# Patient Record
Sex: Male | Born: 1960 | Race: Black or African American | Hispanic: No | Marital: Single
Health system: Southern US, Community
[De-identification: ages and names within clinical notes are randomized; demographics above are authoritative.]

## PROBLEM LIST (undated history)

## (undated) DIAGNOSIS — S0232XA Fracture of orbital floor, left side, initial encounter for closed fracture: Secondary | ICD-10-CM

## (undated) DIAGNOSIS — F32A Depression, unspecified: Secondary | ICD-10-CM

## (undated) DIAGNOSIS — F329 Major depressive disorder, single episode, unspecified: Secondary | ICD-10-CM

## (undated) HISTORY — DX: Fracture of orbital floor, left side, initial encounter for closed fracture: S02.32XA

---

## 2015-04-11 ENCOUNTER — Observation Stay (HOSPITAL_COMMUNITY): Payer: No Typology Code available for payment source

## 2015-04-11 ENCOUNTER — Inpatient Hospital Stay (HOSPITAL_COMMUNITY)
Admission: EM | Admit: 2015-04-11 | Discharge: 2015-04-16 | DRG: 206 | Disposition: A | Discharge status: Home Health | Payer: No Typology Code available for payment source | Attending: General Surgery | Admitting: General Surgery

## 2015-04-11 ENCOUNTER — Emergency Department (HOSPITAL_COMMUNITY): Payer: No Typology Code available for payment source

## 2015-04-11 ENCOUNTER — Encounter (HOSPITAL_COMMUNITY): Payer: Self-pay | Admitting: Emergency Medicine

## 2015-04-11 DIAGNOSIS — S43102A Unspecified dislocation of left acromioclavicular joint, initial encounter: Secondary | ICD-10-CM | POA: Diagnosis present

## 2015-04-11 DIAGNOSIS — S82832A Other fracture of upper and lower end of left fibula, initial encounter for closed fracture: Secondary | ICD-10-CM

## 2015-04-11 DIAGNOSIS — S0230XA Fracture of orbital floor, unspecified side, initial encounter for closed fracture: Secondary | ICD-10-CM

## 2015-04-11 DIAGNOSIS — R52 Pain, unspecified: Secondary | ICD-10-CM

## 2015-04-11 DIAGNOSIS — F4323 Adjustment disorder with mixed anxiety and depressed mood: Secondary | ICD-10-CM | POA: Diagnosis not present

## 2015-04-11 DIAGNOSIS — S2232XA Fracture of one rib, left side, initial encounter for closed fracture: Principal | ICD-10-CM | POA: Diagnosis present

## 2015-04-11 DIAGNOSIS — S8252XA Displaced fracture of medial malleolus of left tibia, initial encounter for closed fracture: Secondary | ICD-10-CM | POA: Diagnosis present

## 2015-04-11 DIAGNOSIS — F1490 Cocaine use, unspecified, uncomplicated: Secondary | ICD-10-CM | POA: Diagnosis present

## 2015-04-11 DIAGNOSIS — Y92410 Unspecified street and highway as the place of occurrence of the external cause: Secondary | ICD-10-CM

## 2015-04-11 DIAGNOSIS — S82831A Other fracture of upper and lower end of right fibula, initial encounter for closed fracture: Secondary | ICD-10-CM | POA: Diagnosis present

## 2015-04-11 DIAGNOSIS — F10129 Alcohol abuse with intoxication, unspecified: Secondary | ICD-10-CM | POA: Diagnosis present

## 2015-04-11 DIAGNOSIS — H5712 Ocular pain, left eye: Secondary | ICD-10-CM | POA: Diagnosis not present

## 2015-04-11 DIAGNOSIS — S0232XA Fracture of orbital floor, left side, initial encounter for closed fracture: Secondary | ICD-10-CM | POA: Diagnosis present

## 2015-04-11 DIAGNOSIS — F19929 Other psychoactive substance use, unspecified with intoxication, unspecified: Secondary | ICD-10-CM | POA: Diagnosis present

## 2015-04-11 HISTORY — DX: Major depressive disorder, single episode, unspecified: F32.9

## 2015-04-11 HISTORY — DX: Depression, unspecified: F32.A

## 2015-04-11 LAB — PROTIME-INR
INR: 0.94 (ref 0.00–1.49)
PROTHROMBIN TIME: 12.8 s (ref 11.6–15.2)

## 2015-04-11 LAB — CBC WITH DIFFERENTIAL/PLATELET
BASOS PCT: 0 %
Basophils Absolute: 0 10*3/uL (ref 0.0–0.1)
EOS PCT: 1 %
Eosinophils Absolute: 0.2 10*3/uL (ref 0.0–0.7)
HCT: 40.6 % (ref 39.0–52.0)
HEMOGLOBIN: 13.8 g/dL (ref 13.0–17.0)
LYMPHS ABS: 3.5 10*3/uL (ref 0.7–4.0)
Lymphocytes Relative: 16 %
MCH: 29.9 pg (ref 26.0–34.0)
MCHC: 34 g/dL (ref 30.0–36.0)
MCV: 87.9 fL (ref 78.0–100.0)
MONOS PCT: 7 %
Monocytes Absolute: 1.5 10*3/uL — ABNORMAL HIGH (ref 0.1–1.0)
NEUTROS ABS: 16.6 10*3/uL — AB (ref 1.7–7.7)
Neutrophils Relative %: 76 %
Platelets: 313 10*3/uL (ref 150–400)
RBC: 4.62 MIL/uL (ref 4.22–5.81)
RDW: 13.5 % (ref 11.5–15.5)
WBC: 21.8 10*3/uL — ABNORMAL HIGH (ref 4.0–10.5)

## 2015-04-11 LAB — COMPREHENSIVE METABOLIC PANEL
ALK PHOS: 85 U/L (ref 38–126)
ALT: 35 U/L (ref 17–63)
ANION GAP: 13 (ref 5–15)
AST: 61 U/L — ABNORMAL HIGH (ref 15–41)
Albumin: 3.4 g/dL — ABNORMAL LOW (ref 3.5–5.0)
BUN: 7 mg/dL (ref 6–20)
CALCIUM: 8.4 mg/dL — AB (ref 8.9–10.3)
CO2: 19 mmol/L — ABNORMAL LOW (ref 22–32)
CREATININE: 0.98 mg/dL (ref 0.61–1.24)
Chloride: 101 mmol/L (ref 101–111)
Glucose, Bld: 98 mg/dL (ref 65–99)
Potassium: 4.9 mmol/L (ref 3.5–5.1)
SODIUM: 133 mmol/L — AB (ref 135–145)
TOTAL PROTEIN: 6 g/dL — AB (ref 6.5–8.1)
Total Bilirubin: 0.9 mg/dL (ref 0.3–1.2)

## 2015-04-11 LAB — URINE MICROSCOPIC-ADD ON

## 2015-04-11 LAB — CK: Total CK: 621 U/L — ABNORMAL HIGH (ref 49–397)

## 2015-04-11 LAB — URINALYSIS, ROUTINE W REFLEX MICROSCOPIC
Bilirubin Urine: NEGATIVE
Glucose, UA: NEGATIVE mg/dL
KETONES UR: NEGATIVE mg/dL
LEUKOCYTES UA: NEGATIVE
NITRITE: NEGATIVE
PH: 5 (ref 5.0–8.0)
Protein, ur: NEGATIVE mg/dL
SPECIFIC GRAVITY, URINE: 1.022 (ref 1.005–1.030)

## 2015-04-11 LAB — I-STAT CG4 LACTIC ACID, ED: Lactic Acid, Venous: 3.57 mmol/L (ref 0.5–2.0)

## 2015-04-11 LAB — I-STAT CHEM 8, ED
BUN: 8 mg/dL (ref 6–20)
CHLORIDE: 99 mmol/L — AB (ref 101–111)
CREATININE: 1.3 mg/dL — AB (ref 0.61–1.24)
Calcium, Ion: 1.04 mmol/L — ABNORMAL LOW (ref 1.12–1.23)
Glucose, Bld: 97 mg/dL (ref 65–99)
HEMATOCRIT: 46 % (ref 39.0–52.0)
Hemoglobin: 15.6 g/dL (ref 13.0–17.0)
POTASSIUM: 4.6 mmol/L (ref 3.5–5.1)
SODIUM: 134 mmol/L — AB (ref 135–145)
TCO2: 22 mmol/L (ref 0–100)

## 2015-04-11 LAB — RAPID URINE DRUG SCREEN, HOSP PERFORMED
AMPHETAMINES: NOT DETECTED
Barbiturates: NOT DETECTED
Benzodiazepines: NOT DETECTED
COCAINE: POSITIVE — AB
OPIATES: NOT DETECTED
Tetrahydrocannabinol: POSITIVE — AB

## 2015-04-11 LAB — I-STAT TROPONIN, ED: Troponin i, poc: 0 ng/mL (ref 0.00–0.08)

## 2015-04-11 LAB — ETHANOL: ALCOHOL ETHYL (B): 245 mg/dL — AB (ref <5)

## 2015-04-11 LAB — CBG MONITORING, ED: Glucose-Capillary: 103 mg/dL — ABNORMAL HIGH (ref 65–99)

## 2015-04-11 LAB — LIPASE, BLOOD: LIPASE: 27 U/L (ref 11–51)

## 2015-04-11 MED ORDER — OXYCODONE HCL 5 MG PO TABS
5.0000 mg | ORAL_TABLET | ORAL | Status: DC | PRN
  Administered 2015-04-11 – 2015-04-14 (×8): 5 mg via ORAL
  Filled 2015-04-11 (×8): qty 1

## 2015-04-11 MED ORDER — SODIUM CHLORIDE 0.9 % IV SOLN
INTRAVENOUS | Status: DC
  Administered 2015-04-11: 125 mL/h via INTRAVENOUS
  Administered 2015-04-12: 10:00:00 via INTRAVENOUS
  Administered 2015-04-13: 1000 mL via INTRAVENOUS
  Administered 2015-04-13 – 2015-04-14 (×2): via INTRAVENOUS

## 2015-04-11 MED ORDER — IOHEXOL 300 MG/ML  SOLN
80.0000 mL | Freq: Once | INTRAMUSCULAR | Status: AC | PRN
Start: 2015-04-11 — End: 2015-04-11
  Administered 2015-04-11: 100 mL via INTRAVENOUS

## 2015-04-11 MED ORDER — TETANUS-DIPHTH-ACELL PERTUSSIS 5-2.5-18.5 LF-MCG/0.5 IM SUSP
0.5000 mL | Freq: Once | INTRAMUSCULAR | Status: AC
  Administered 2015-04-11: 0.5 mL via INTRAMUSCULAR
  Filled 2015-04-11: qty 0.5

## 2015-04-11 MED ORDER — ONDANSETRON HCL 4 MG/2ML IJ SOLN: 4.0000 mg | Freq: Four times a day (QID) | INTRAMUSCULAR | Status: DC | PRN

## 2015-04-11 MED ORDER — MORPHINE SULFATE (PF) 4 MG/ML IV SOLN
4.0000 mg | INTRAVENOUS | Status: DC | PRN
  Administered 2015-04-11 – 2015-04-13 (×13): 4 mg via INTRAVENOUS
  Filled 2015-04-11 (×13): qty 1

## 2015-04-11 MED ORDER — TETRACAINE HCL 0.5 % OP SOLN
2.0000 [drp] | Freq: Once | OPHTHALMIC | Status: AC
  Administered 2015-04-11: 2 [drp] via OPHTHALMIC
  Filled 2015-04-11: qty 2

## 2015-04-11 MED ORDER — SODIUM CHLORIDE 0.9 % IV BOLUS (SEPSIS)
2000.0000 mL | Freq: Once | INTRAVENOUS | Status: AC
  Administered 2015-04-11: 2000 mL via INTRAVENOUS

## 2015-04-11 MED ORDER — ONDANSETRON HCL 4 MG PO TABS: 4.0000 mg | ORAL_TABLET | Freq: Four times a day (QID) | ORAL | Status: DC | PRN

## 2015-04-11 NOTE — ED Notes (Signed)
Attempted report 
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
## 2015-04-11 NOTE — ED Notes (Signed)
Spoke with Dr Janee Morn RE ENT consultation request per Opthomology.

## 2015-04-11 NOTE — ED Notes (Signed)
Patient transported to CT SCAN / X-RAY.

## 2015-04-11 NOTE — Progress Notes (Signed)
Patient ID: Christopher Huynh, male   DOB: September 11, 1960, 55 y.o.   MRN: [id number] I have reviewed the patient's x-rays.  He does have a proximal fibula fracture.  This fracture will need no operative intervention.  If he is having significant pain with ambulation he could try a knee immobilizer.  If his pain is not significantly with ambulation he could go without.  I will see him later today or tomorrow and right a full consult.  For any questions please call me at [phone]
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
## 2015-04-11 NOTE — Progress Notes (Signed)
PT Cancellation Note  Patient Details Name: Christopher Huynh MRN: [id number] DOB: 1960/08/31   Cancelled Treatment:    Reason Eval/Treat Not Completed: Pain limiting ability to participate (Pt asks if he needs to do work for motivation proof). Discussed goals and objectives for tx and will continue to try later today to see if possible.     Ivar Drape 04/11/2015, 10:16 AM   Samul Dada, PT MS Acute Rehab Dept. Number: [facility] [id number] and [facility] [phone]
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
## 2015-04-11 NOTE — Consult Note (Signed)
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 surrounding the left orbit. Soft tissue swelling is noted at the eyelids bilaterally. The parapharyngeal fat planes are preserved. The nasopharynx, oropharynx and hypopharynx are unremarkable in appearance. The visualized portions of the valleculae and piriform sinuses are grossly unremarkable. The parotid and submandibular glands are within normal limits. No cervical lymphadenopathy is seen. CT CERVICAL SPINE FINDINGS There is no evidence of fracture or subluxation. Vertebral bodies demonstrate normal height and alignment. Intervertebral there is mild intervertebral disc space narrowing at C6-C7, with scattered anterior and posterior disc osteophyte complexes seen. Prevertebral soft tissues are within normal limits. The thyroid gland is unremarkable in appearance. Mild scattered blebs are noted at the lung apices, with minimal associated scarring. No significant soft tissue abnormalities are seen. IMPRESSION: 1. No evidence of traumatic intracranial injury. 2. Comminuted fracture of the left orbital floor, with inferior herniation of intraorbital fat into the left maxillary sinus. The inferior rectus muscle abuts the fracture site, raising concern for potential entrapment. Small amount of blood noted within  the left maxillary sinus. 3. Small amount of intraorbital blood noted tracking posterior to the optic globes bilaterally. Underlying mild injury within the intraorbital fat at the left orbit. Bilateral proptosis noted, more prominent on the left. 4. Soft tissue swelling about the eyelids bilaterally, and prominent soft tissue swelling overlying the left maxilla and surrounding the left orbit. 5. Mild partial opacification of the sphenoid sinus. 6. No evidence of fracture or subluxation along the cervical spine. 7. Minimal degenerative change at the lower cervical spine. 8. Mild scattered blebs at the lung apices, with minimal associated scarring. These results were called by telephone at the time of interpretation on 04/11/2015 at 5:48 am to Dr. Tomasita Crumble, who verbally acknowledged these results. Electronically Signed   By: Roanna Raider M.D.   On: 04/11/2015 05:49    Assessment and Recommendation: 1. Left orbital Floor Fracture: Large and comminuted, expect limitation of upgaze secondary to neuromuscular paresis. Does not appear entrapped on exam. Given size of fracture will likely require repair and would consult ENT for evaluation.   -Globe intact  -Consider oral antibiotic such as keflex 500mg  tid x 10 days  -Frequent ICE to reduce swelling of lids  -No nose blowing or using straw until after fracture repaired and heals  -IOP OU has improved since checked by ED earlier this morning.   -I will see in clinic in 1 week.   2. Hemorrhagic chemosis OU:  -Recommend Erythromycin ointment TID both eyes for lubrication until lid edema and hemorrhagic chemosis resolves (likely 2-3 days).    Please call with any questions.  Sinda Du MD [facility] [phone]

## 2015-04-11 NOTE — ED Notes (Addendum)
Opthomologist Dr Cathey Endow at bedside.
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
## 2015-04-11 NOTE — ED Notes (Addendum)
Removed 3 silver rings from pt fingers. Mother, Selecer has pt rings. Pt agreed that it was ok for mother to keep rings.  1 ring is cut.

## 2015-04-11 NOTE — ED Notes (Signed)
Patient transported to X-ray 
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
## 2015-04-11 NOTE — ED Notes (Signed)
  [facility]G 103  

## 2015-04-12 ENCOUNTER — Observation Stay (HOSPITAL_COMMUNITY): Payer: No Typology Code available for payment source

## 2015-04-12 DIAGNOSIS — S43102A Unspecified dislocation of left acromioclavicular joint, initial encounter: Secondary | ICD-10-CM | POA: Diagnosis present

## 2015-04-12 DIAGNOSIS — Y92410 Unspecified street and highway as the place of occurrence of the external cause: Secondary | ICD-10-CM | POA: Diagnosis not present

## 2015-04-12 DIAGNOSIS — F19129 Other psychoactive substance abuse with intoxication, unspecified: Secondary | ICD-10-CM | POA: Diagnosis not present

## 2015-04-12 DIAGNOSIS — S2232XA Fracture of one rib, left side, initial encounter for closed fracture: Secondary | ICD-10-CM | POA: Diagnosis present

## 2015-04-12 DIAGNOSIS — F1490 Cocaine use, unspecified, uncomplicated: Secondary | ICD-10-CM | POA: Diagnosis present

## 2015-04-12 DIAGNOSIS — S8252XA Displaced fracture of medial malleolus of left tibia, initial encounter for closed fracture: Secondary | ICD-10-CM | POA: Diagnosis present

## 2015-04-12 DIAGNOSIS — F10129 Alcohol abuse with intoxication, unspecified: Secondary | ICD-10-CM | POA: Diagnosis present

## 2015-04-12 DIAGNOSIS — S0232XA Fracture of orbital floor, left side, initial encounter for closed fracture: Secondary | ICD-10-CM | POA: Diagnosis present

## 2015-04-12 DIAGNOSIS — F4323 Adjustment disorder with mixed anxiety and depressed mood: Secondary | ICD-10-CM | POA: Diagnosis not present

## 2015-04-12 DIAGNOSIS — S82831A Other fracture of upper and lower end of right fibula, initial encounter for closed fracture: Secondary | ICD-10-CM | POA: Diagnosis present

## 2015-04-12 DIAGNOSIS — H5712 Ocular pain, left eye: Secondary | ICD-10-CM | POA: Diagnosis present
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
## 2015-04-12 NOTE — Progress Notes (Signed)
Inpatient Rehabilitation  We received a prescreen request from PT for possible IP Rehab.  Pt. Is currently admitted under observation status and likely does not have the medical complexity to warrant IP Rehab admission.  At this time we are not recommending IP Rehab consult.  Please call if questions.  Weldon Picking PT Inpatient Rehab Admissions Coordinator Cell [phone] Office [phone]

## 2015-04-12 NOTE — Progress Notes (Signed)
Orthopedic Tech Progress Note Patient Details:  Christopher Huynh 06/11/1960 [id number] Applied Velcro knee immobilizer to RLE. Ortho Devices Type of Ortho Device: Knee Immobilizer Ortho Device/Splint Location: RLE Ortho Device/Splint Interventions: Application   [name] 04/12/2015, 3:13 PM
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
## 2015-04-13 LAB — LACTIC ACID, PLASMA
Lactic Acid, Venous: 0.6 mmol/L (ref 0.5–2.0)
Lactic Acid, Venous: 1.9 mmol/L (ref 0.5–2.0)

## 2015-04-13 LAB — BASIC METABOLIC PANEL
Anion gap: 9 (ref 5–15)
CO2: 25 mmol/L (ref 22–32)
Calcium: 8.8 mg/dL — ABNORMAL LOW (ref 8.9–10.3)
Chloride: 102 mmol/L (ref 101–111)
Creatinine, Ser: 0.97 mg/dL (ref 0.61–1.24)
GFR calc Af Amer: >60 mL/min (ref >60)
GLUCOSE: 103 mg/dL — AB (ref 65–99)
POTASSIUM: 4.1 mmol/L (ref 3.5–5.1)
Sodium: 136 mmol/L (ref 135–145)

## 2015-04-13 LAB — CBC WITH DIFFERENTIAL/PLATELET
Basophils Absolute: 0 10*3/uL (ref 0.0–0.1)
Basophils Relative: 0 %
EOS PCT: 2 %
Eosinophils Absolute: 0.2 10*3/uL (ref 0.0–0.7)
HCT: 33.4 % — ABNORMAL LOW (ref 39.0–52.0)
Hemoglobin: 11.3 g/dL — ABNORMAL LOW (ref 13.0–17.0)
LYMPHS ABS: 1.2 10*3/uL (ref 0.7–4.0)
LYMPHS PCT: 13 %
MCH: 30.8 pg (ref 26.0–34.0)
MCHC: 33.8 g/dL (ref 30.0–36.0)
MCV: 91 fL (ref 78.0–100.0)
MONO ABS: 1.1 10*3/uL — AB (ref 0.1–1.0)
MONOS PCT: 11 %
Neutro Abs: 6.9 10*3/uL (ref 1.7–7.7)
Neutrophils Relative %: 74 %
PLATELETS: 215 10*3/uL (ref 150–400)
RBC: 3.67 MIL/uL — AB (ref 4.22–5.81)
RDW: 13.5 % (ref 11.5–15.5)
WBC: 9.4 10*3/uL (ref 4.0–10.5)

## 2015-04-13 MED ORDER — NICOTINE 14 MG/24HR TD PT24
14.0000 mg | MEDICATED_PATCH | Freq: Every day | TRANSDERMAL | Status: DC
  Administered 2015-04-13 – 2015-04-16 (×4): 14 mg via TRANSDERMAL
  Filled 2015-04-13 (×4): qty 1
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
## 2015-04-14 MED ORDER — OXYCODONE HCL 5 MG PO TABS
10.0000 mg | ORAL_TABLET | ORAL | Status: DC | PRN
  Administered 2015-04-14 – 2015-04-15 (×5): 15 mg via ORAL
  Administered 2015-04-16: 10 mg via ORAL
  Administered 2015-04-16: 15 mg via ORAL
  Filled 2015-04-14 (×5): qty 3
  Filled 2015-04-14: qty 2
  Filled 2015-04-14: qty 3
  Filled 2015-04-14: qty 2

## 2015-04-14 MED ORDER — KETOROLAC TROMETHAMINE 30 MG/ML IJ SOLN
30.0000 mg | Freq: Once | INTRAMUSCULAR | Status: AC
  Administered 2015-04-14: 30 mg via INTRAVENOUS
  Filled 2015-04-14: qty 1

## 2015-04-14 MED ORDER — KETOROLAC TROMETHAMINE 15 MG/ML IJ SOLN
15.0000 mg | Freq: Four times a day (QID) | INTRAMUSCULAR | Status: AC
  Administered 2015-04-14 – 2015-04-16 (×8): 15 mg via INTRAVENOUS
  Filled 2015-04-14 (×8): qty 1

## 2015-04-14 MED ORDER — SODIUM CHLORIDE 0.9% FLUSH: 3.0000 mL | INTRAVENOUS | Status: DC | PRN

## 2015-04-14 MED ORDER — HYDROMORPHONE HCL 1 MG/ML IJ SOLN
1.0000 mg | INTRAMUSCULAR | Status: DC | PRN
  Administered 2015-04-14: 1 mg via INTRAVENOUS
  Filled 2015-04-14: qty 1

## 2015-04-14 NOTE — Progress Notes (Signed)
   04/14/15 1607  Clinical Encounter Type  Visited With Patient and family together;Patient;Health care provider  Visit Type Initial;Spiritual support  Referral From Physician  Spiritual Encounters  Spiritual Needs Prayer;Emotional   Chaplain responded to a request to visit with a patient. Chaplain facilitated a bit of life review and spiritual assessment, and offered empathic listening, prayer, and support. Chaplain services available as needed.   Alda Ponder, Chaplain 04/14/2015 4:08 PM
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
## 2015-04-15 DIAGNOSIS — F4323 Adjustment disorder with mixed anxiety and depressed mood: Secondary | ICD-10-CM | POA: Clinically undetermined

## 2015-04-15 DIAGNOSIS — F19929 Other psychoactive substance use, unspecified with intoxication, unspecified: Secondary | ICD-10-CM | POA: Diagnosis present
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
## 2015-04-15 NOTE — Progress Notes (Signed)
Orthopedic Tech Progress Note Patient Details:  Christopher Huynh 1960-09-26 [id number]  Ortho Devices Type of Ortho Device: CAM walker Ortho Device/Splint Location: lle Ortho Device/Splint Interventions: Application   Ladana Chavero 04/15/2015, 12:13 PM
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
## 2015-04-16 DIAGNOSIS — F19129 Other psychoactive substance abuse with intoxication, unspecified: Secondary | ICD-10-CM

## 2015-04-16 DIAGNOSIS — F4323 Adjustment disorder with mixed anxiety and depressed mood: Secondary | ICD-10-CM

## 2015-04-16 MED ORDER — OXYCODONE-ACETAMINOPHEN 10-325 MG PO TABS: 1.0000 | ORAL_TABLET | ORAL | Status: AC | PRN
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
## 2015-04-16 NOTE — Discharge Instructions (Signed)
No driving while taking oxycodone.  Use knee immobilizer and CAM walker boot when walking.  Walker to be delivered by [facility] within the next week.
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
## 2015-04-17 NOTE — Care Management (Signed)
Received call from Darla at [facility], stating that Mr. Marchio has called inquiring about ortho follow up.  He denies having any information about his appointments on discharge instructions.  Reviewed all follow up information with Darla, including follow up appts needed for ortho, ophthalmology, and plastic surgery.  Darla states she will notify pt and go over information with him.    Quintella Baton, RN, BSN  Trauma/Neuro ICU Case Manager [phone]

## 2015-04-21 ENCOUNTER — Encounter: Payer: Self-pay | Admitting: Plastic Surgery
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
## 2019-06-28 ENCOUNTER — Emergency Department (HOSPITAL_COMMUNITY)
Admission: EM | Admit: 2019-06-28 | Discharge: 2019-06-28 | Disposition: A | Discharge status: Home / Self Care | Payer: No Typology Code available for payment source | Attending: Emergency Medicine | Admitting: Emergency Medicine

## 2019-06-28 ENCOUNTER — Encounter (HOSPITAL_COMMUNITY): Payer: Self-pay | Admitting: Emergency Medicine

## 2019-06-28 ENCOUNTER — Other Ambulatory Visit: Payer: Self-pay

## 2019-06-28 DIAGNOSIS — R200 Anesthesia of skin: Secondary | ICD-10-CM | POA: Insufficient documentation

## 2019-06-28 DIAGNOSIS — F1410 Cocaine abuse, uncomplicated: Secondary | ICD-10-CM | POA: Diagnosis not present

## 2019-06-28 DIAGNOSIS — R202 Paresthesia of skin: Secondary | ICD-10-CM | POA: Insufficient documentation

## 2019-06-28 MED ORDER — PREDNISONE 10 MG PO TABS: 20.0000 mg | ORAL_TABLET | Freq: Two times a day (BID) | ORAL | 0 refills | Status: AC
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
## 2019-06-28 NOTE — ED Triage Notes (Signed)
Patient reports tingling to bilateral arms x1 month and swelling to bilateral hands and fatigue x1 week.

## 2019-06-28 NOTE — ED Notes (Signed)
An After Visit Summary was printed and given to the patient. Discharge instructions given and no further questions at this time.  

## 2019-06-28 NOTE — Discharge Instructions (Signed)
Begin taking prednisone as prescribed.  Follow-up with primary doctor if not improving in the next week to discuss imaging studies.  Return to the emergency department in the meantime if you develop worsening weakness, numbness, or other new and concerning symptoms.
